# Patient Record
Sex: Female | Born: 1950 | Race: White | Hispanic: No | Marital: Married
Health system: Southern US, Community
[De-identification: ages and names within clinical notes are randomized; demographics above are authoritative.]

## PROBLEM LIST (undated history)

## (undated) DIAGNOSIS — G610 Guillain-Barre syndrome: Secondary | ICD-10-CM

## (undated) DIAGNOSIS — J45909 Unspecified asthma, uncomplicated: Secondary | ICD-10-CM

## (undated) DIAGNOSIS — E119 Type 2 diabetes mellitus without complications: Secondary | ICD-10-CM

---

## 2000-10-09 ENCOUNTER — Emergency Department (HOSPITAL_COMMUNITY): Admission: EM | Admit: 2000-10-09 | Discharge: 2000-10-09 | Payer: Self-pay | Admitting: Emergency Medicine

## 2000-10-09 ENCOUNTER — Encounter: Payer: Self-pay | Admitting: Emergency Medicine

## 2007-03-29 ENCOUNTER — Inpatient Hospital Stay (HOSPITAL_COMMUNITY): Admission: RE | Admit: 2007-03-29 | Discharge: 2007-04-01 | Payer: Self-pay | Admitting: Orthopedic Surgery

## 2007-05-24 ENCOUNTER — Ambulatory Visit (HOSPITAL_COMMUNITY): Admission: RE | Admit: 2007-05-24 | Discharge: 2007-05-25 | Payer: Self-pay | Admitting: Orthopedic Surgery

## 2008-05-11 IMAGING — CR DG CHEST 2V
3 series · 3 of 3 positions shown · non-contrast
Comparison: none

CLINICAL DATA: Osteoarthritis left knee. Preop workup. History of asthma, HBP. 
 CHEST - 2 VIEW:

[view not recorded (1 of 3)]
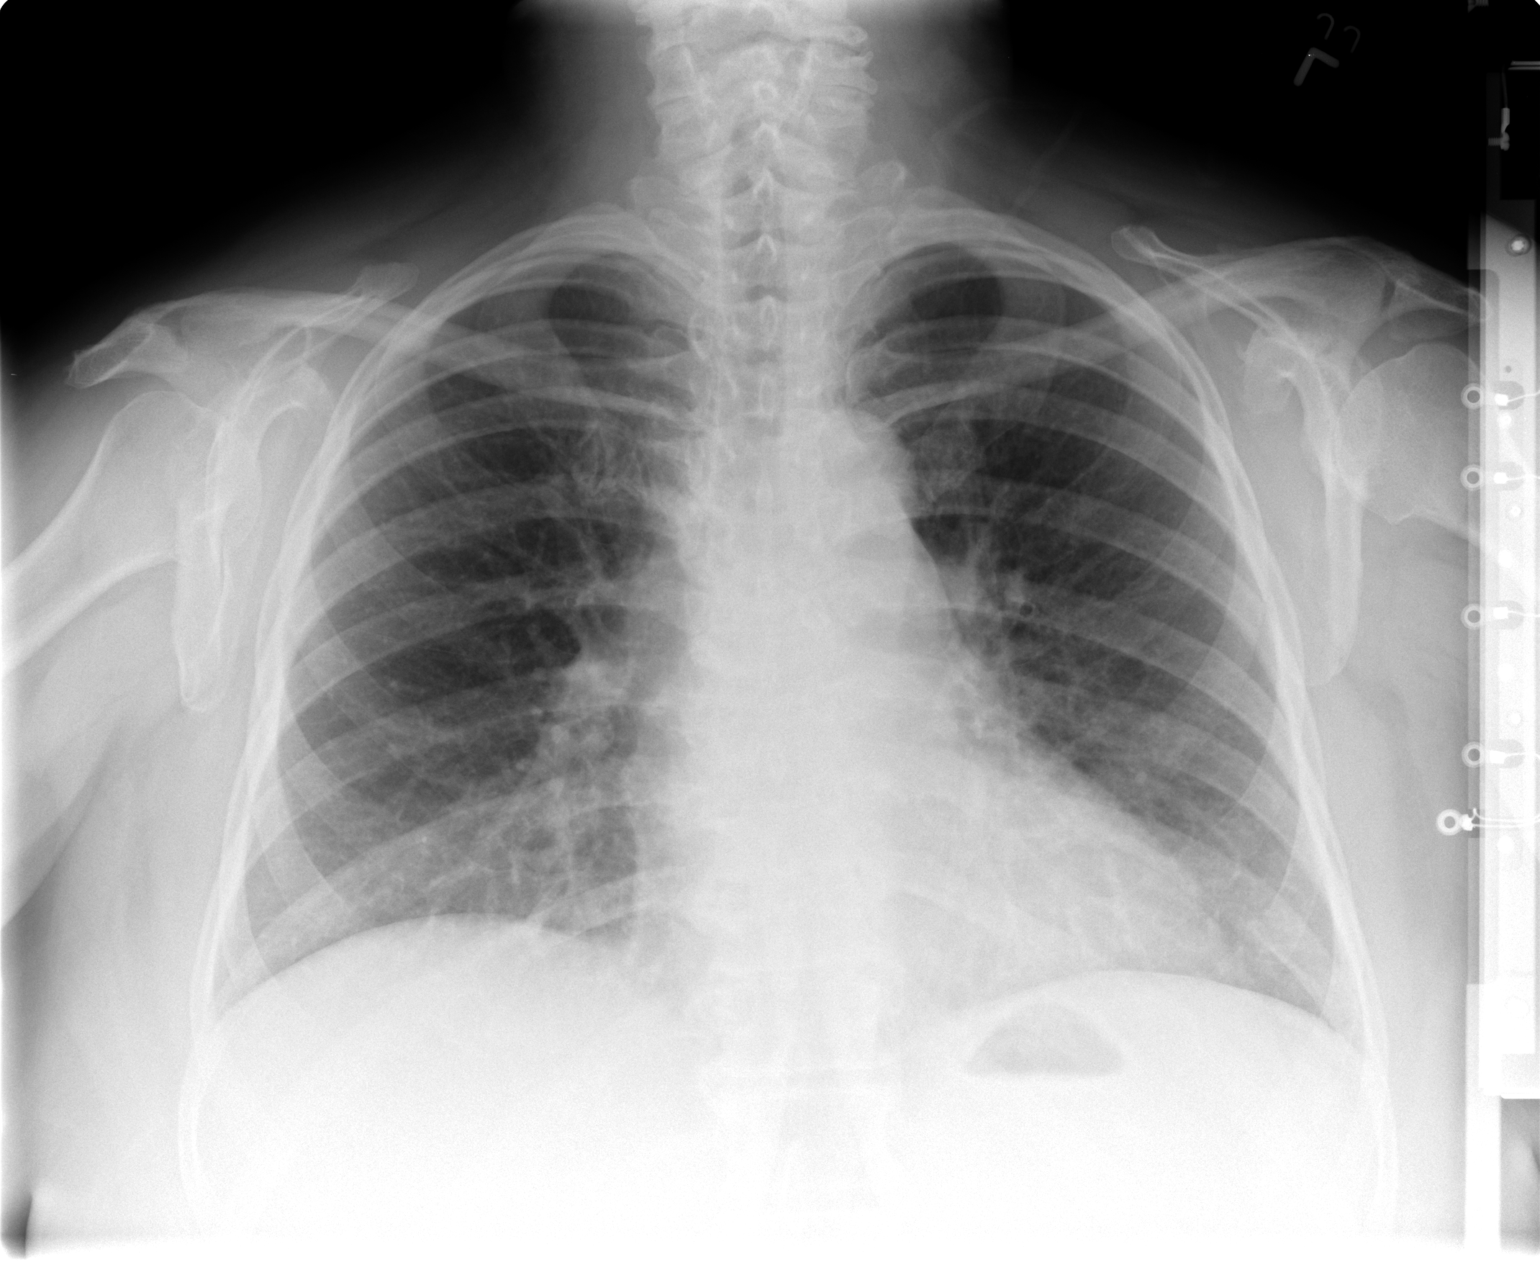

[view not recorded (2 of 3)]
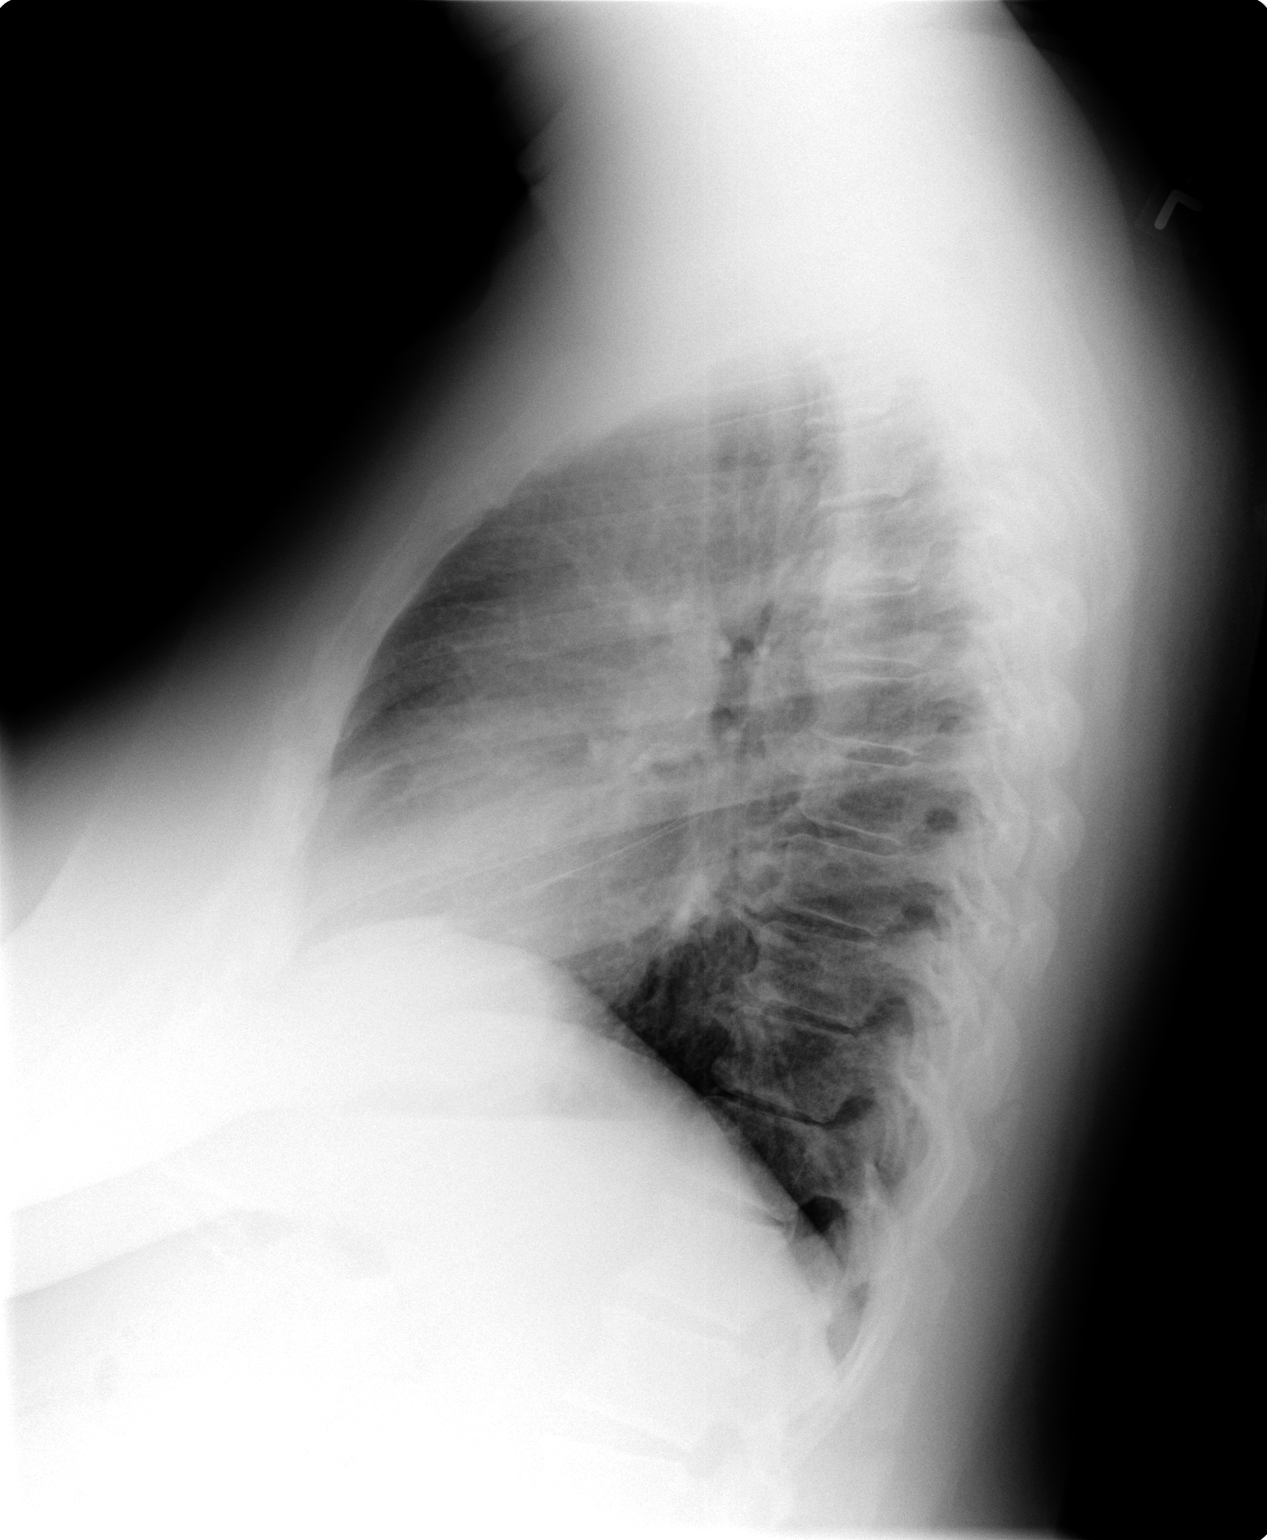

[view not recorded (3 of 3)]
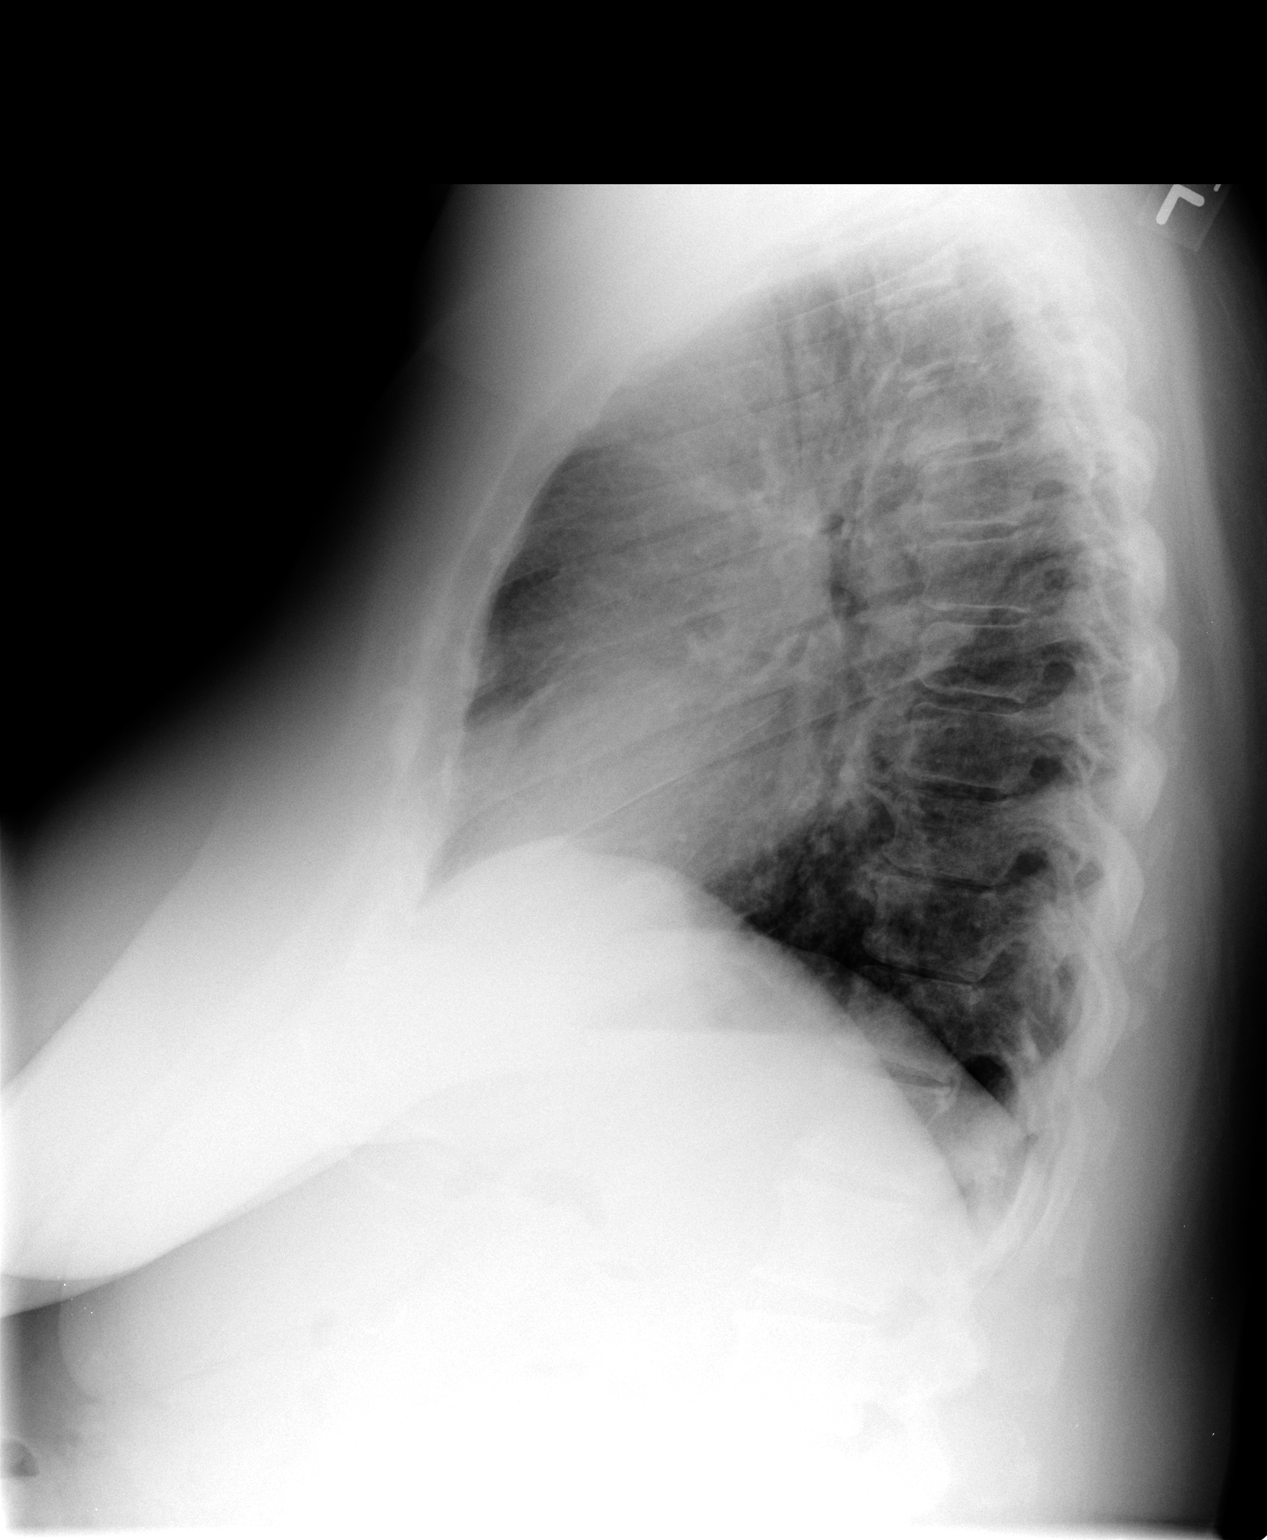

[3 of 3 positions shown; findings below may reference images not displayed]

FINDINGS: The cardiac size is towards the upper limits of normal. Peribronchial thickening compatible with chronic bronchitic changes. No active infiltrate. Bony thorax intact.
IMPRESSION: No acute chest findings. Chronic bronchitic findings.

## 2011-02-10 NOTE — Op Note (Signed)
[name], [name]            ACCOUNT NO.:  [id number]   MEDICAL RECORD NO.:  [id number]          PATIENT TYPE:  OIB   LOCATION:  1604                         FACILITY:  [facility]   PHYSICIAN:  Madlyn Frankel. Charlann Boxer, M.D.  DATE OF BIRTH:  [birth year]   DATE OF PROCEDURE:  05/24/2007  DATE OF DISCHARGE:                               OPERATIVE REPORT   PREOPERATIVE DIAGNOSIS:  Left total knee arthrofibrosis.   POSTOPERATIVE DIAGNOSIS:  Left total knee arthrofibrosis.   OPERATION PERFORMED:  1. Left knee examination under anesthesia.  Findings including range      of motion of 5 to 60 degrees.  2. Left knee manipulation under anesthesia with postoperative range of      motion from 3 to 130 degrees with significant lysis of adhesions      noted.   SURGEON:  Madlyn Frankel. Charlann Boxer, M.D.   ASSISTANT:  None.   ANESTHESIA:  Femoral regional nerve block and general LMA.   COMPLICATIONS:  None.   INDICATIONS FOR PROCEDURE:  [name] is a 61 year old female who is  now roughly seven weeks out from a left total knee replacement.  Her  postoperative course has been complicated only by some stiffness due to  inability to get adequate pain relief.  She is a diabetic.  I discussed  with her the current situation and recommended at this point given her  lack of progression from her two to six week visit, that we undergo  manipulation under anesthesia.  The risks and benefits were discussed.  Consent obtained.   DESCRIPTION OF PROCEDURE:  The patient was brought to the operating  theater.  Once adequate anesthesia was established and examination under  anesthesia was carried out, she had a 5 degree flexion contracture but  only flexed to about 60 to 70 degrees.  Very gentle pressure was applied  to the proximal fibula and distal femur with significant lysis of  adhesions.  I was able to flex it back to 130 degrees without much  effort.   I did work a little bit on her extension and was able to  lyse some  adhesions posteriorly improving her overall extension to close to 0  degrees.   I manipulated her patella and found that it was pretty mobile as well  signifying no significant scarring around this patella.  Following the  manipulation, the patient was awakened from anesthesia and brought to  recovery room in stable condition.   PLAN:  Plan will be for her to go on CPM immediately and then home on  CPM followed by immediate return to outpatient physical therapy.      Madlyn Frankel Charlann Boxer, M.D.  Electronically Signed     MDO/MEDQ  D:  05/24/2007  T:  05/25/2007  Job:  [id number]

## 2011-02-10 NOTE — Op Note (Signed)
[name], [name]            ACCOUNT NO.:  [id number]   MEDICAL RECORD NO.:  [id number]          PATIENT TYPE:  INP   LOCATION:  X002                         FACILITY:  [facility]   PHYSICIAN:  Madlyn Frankel. Charlann Boxer, M.D.  DATE OF BIRTH:  05/24/1951   DATE OF PROCEDURE:  03/29/2007  DATE OF DISCHARGE:                               OPERATIVE REPORT   PREOPERATIVE DIAGNOSIS:  Left knee osteoarthritis.   POSTOPERATIVE DIAGNOSIS:  Left knee osteoarthritis.   PROCEDURE:  Left total knee replacement.   COMPONENTS USED:  DePuy rotating platform posterior stabilized knee  system, size 3 femur, 3 tibia, 10 mm insert and a 38 patella button.   SURGEON:  Charlann Boxer.   ASSISTANT:  Dwyane Luo, P.A.   ANESTHESIA:  Duramorph spinal plus MAC anesthesia.   BLOOD LOSS:  Minimal.   TOURNIQUET TIME:  60 minutes, 300 mmHg.   COMPLICATIONS:  None.   DRAINS:  None.   INDICATIONS FOR PROCEDURE:  [name] is a 60 year old female who  presented to the office for left knee pain.  She failed conservative  measures.  Given the persistent and recurring symptoms, she wished to  proceed with surgery.  Radiographs indicated end-stage medial and  proximal changes.  Risks, benefits of surgery were discussed including  infection, DVT, component failure, need for revision as well as  postoperative pain and stiffness.  Consent obtained.   PROCEDURE IN DETAIL:  The patient was brought to the operative theater.  Once adequate anesthesia and preoperative antibiotics, 1 g of vancomycin  due to allergy administered, patient was positioned supine and a  proximal thigh tourniquet placed.  The left lower extremity was then  prescrubbed and prepped and draped in a sterile fashion.  A midline  incision was made followed by a median parapatellar arthrotomy with  patella subluxation as opposed to eversion of the patella.  Following  primary knee exposure, attention was directed to the patella.  Precut  measurement was 26 mm.   I resected down to 15 mm and used a 38 patella  button which took me back to 24 mm of patella height.   At this point attention was directed to the femur.  Femoral canal was  opened, irrigated to prevent fat emboli and intramedullary rod was then  passed.  I resected it 5 degrees valgus to the left knee, 11 mm of bone  due to a 5 mm flexion contracture preop.   I then sized the femur and determined it was going to be size 3,  anterior, posterior and Chamfer cuts were then made.  I did add a slight  bit of external rotation to the femoral cutting block matching a  perpendicular line to Whiteside's line in relationship to the  epicondylar axis.   I went ahead and finished the femoral preparation placing the trochlear  box cutting jig on the lateral aspect of the distal femur.  At this  point, attention was directed to the tibia.  Tibial exposure obtained,  meniscectomy was carried out as needed at this point.  I used an  extramedullary guide and resected 10 mm of bone  off the lateral tibia  and proximal tibia.  Following this resection, I attempted to pass an  extension block into place.  I felt that it was a little snug, so I  dropped down and removed two more millimeters of bone.  Following  cleaning up the bone cuts and removing any debris, I rechecked the  extension blocks and found the knee came out to full extension.  At this  point, I went ahead and finished the tibial surface with a size 3 tibial  tray including drilling and keel punching.  I did check with an  alignment rod to make sure it passed perpenicularly in the coronal and  sagittal planes.  At this point trial reduction was carried out with  size 3 tibia, 3 femur and 10 mm insert and again the knee came out in  full extension, had very stable ligament balance from extension to  flexion and the patella was noted to track without any application of  pressure.   At this point all trial components were removed, I  irrigated the knee  with pulse lavage, normal saline solution and injected the knee then in  the synovial cavity with 50 mL of 0.25% Marcaine with epinephrine and 1  mL of Toradol.   Cement was mixed, the final components were cemented in position,  extruded cement was removed once the knee was brought out to extension  with a 10 mL insert placed.  Once the cement had cured excessive cement  was debrided.  Once I was satisfied there was no remaining cement in the  knee, I irrigated the knee one more time, injected at few mL of FloSeal  into the posterior aspect of the knee and medial and lateral gutters and  then placed a final 10 x 3 posterior stabilized RP insert.  The knee was  reduced, remaining FloSeal placed into the gutters.  At this point the  knee was brought into flexion and extensor mechanism reapproximated  using #1 Vicryl, the remainder of the wound was closed in layers with #2-  0 Vicryl and #4-0 running Monocryl.  The knee was then dressed into a  sterile bulky dry dressing.  She was brought to the recovery room in  stable condition.      Madlyn Frankel Charlann Boxer, M.D.  Electronically Signed     MDO/MEDQ  D:  03/29/2007  T:  03/29/2007  Job:  [id number]

## 2011-02-10 NOTE — H&P (Signed)
NAMETALLY, MATTOX            ACCOUNT NO.:  [id number]   MEDICAL RECORD NO.:  [id number]        PATIENT TYPE:  LINP   LOCATION:                               FACILITY:  [facility]   PHYSICIAN:  Madlyn Frankel. Charlann Boxer, M.D.  DATE OF BIRTH:  [birth year]   DATE OF ADMISSION:  03/29/2007  DATE OF DISCHARGE:                              HISTORY & PHYSICAL   PROCEDURES:  A left total knee arthroplasty.   CHIEF COMPLAINT:  Left knee pain.   HISTORY OF PRESENT ILLNESS:  A 60 year old female with persistent  progressive knee pain secondary to osteoarthritis.  It has been  refractory to all conservative treatments.  She has been presurgically  assessed by Dr. Tarri Fuller.  Marland Kitchen   PAST MEDICAL HISTORY:  1. Osteoarthritis.  2. Diabetes.  3. Hypertension.   PAST SURGICAL HISTORY:  Tonsillectomy, appendectomy, cholecystectomy,  endometrial ablation, two foot surgeries, two knee arthroscopic  surgeries, and carpal tunnel release in 2006.   FAMILY HISTORY:  Heart disease, hypertension, diabetes, cancer, stroke,  arthritis.   SOCIAL HISTORY:  Husband will be primary caregiver after surgery.   DRUG ALLERGIES:  PENICILLIN, CODEINE, CLINDAMYCIN which give  anaphylactic reaction.   MEDICATIONS:  1. Tramadol 50 mg p.o. daily p.r.n.  2. Diovan 160 mg one p.o. daily.  3. Metoprolol 50 mg one p.o. daily.  4. Glimepiride 1 mg one p.o. daily.  5. Aspirin 81 mg one p.o. daily.  6. Methotrexate 2.5 mg four once a week.   REVIEW OF SYSTEMS:  PULMONARY:  She has history of asthma, bronchitis.  GYNECOLOGICAL:  Postmenopausal with hot flashes.  Otherwise, see HPI.   PHYSICAL EXAMINATION:  VITAL SIGNS:  Pulse 76, respirations 18, blood  pressure 174/86.  GENERAL:  Awake, alert, and oriented, well-developed, well-nourished, in  no acute distress.  NECK:  Supple.  No carotid bruits.  CHEST:  Lungs clear to auscultation bilaterally.  BREASTS:  Deferred.  HEART:  Regular rate and rhythm without gallops,  clicks, rubs or  murmurs.  ABDOMEN:  Soft, nontender, nondistended.  Bowel sounds present.  GENITOURINARY:  Deferred.  EXTREMITIES:  Left lower extremity has flexion contracture, medial sided  tenderness.  She has a dorsalis pedis pulse.  SKIN:  No cellulitis.  Old scope portals well healed.  NEUROLOGIC:  Intact distal sensibilities.   LABORATORY DATA:  Labs, chest x-ray, EKG pending presurgical assessment.   IMPRESSION:  1. Osteoarthritis.  2. Diabetes type 2.  3. Hypertension.   PLAN OF ACTION:  Left total knee arthroplasty by surgeon Dr. Durene Romans on March 29, 2007 at [facility].  Risks and complications  were discussed.  Questions were encouraged, answered, and reviewed.   Postoperative medication prescription provided as well as Celebrex for a  preoperative treatment as well as postoperative for two days.     ______________________________  Yetta Glassman [address], [place]      Madlyn Frankel. Charlann Boxer, M.D.  Electronically Signed    BLM/MEDQ  D:  03/15/2007  T:  03/15/2007  Job:  [id number]

## 2011-02-13 NOTE — Discharge Summary (Signed)
[name], [name]            ACCOUNT NO.:  [id number]   MEDICAL RECORD NO.:  [id number]          PATIENT TYPE:  INP   LOCATION:  1603                         FACILITY:  [facility]   PHYSICIAN:  Madlyn Frankel. Charlann Boxer, M.D.  DATE OF BIRTH:  [birth year]   DATE OF ADMISSION:  03/29/2007  DATE OF DISCHARGE:  04/01/2007                               DISCHARGE SUMMARY   ADMISSION DIAGNOSES:  1. Osteoarthritis.  2. Diabetes, type 2.  3. Hypertension.   DISCHARGE DIAGNOSES:  1. Osteoarthritis.  2. Diabetes, type 2.  3. Hypertension.   CONSULTATIONS:  None.   PROCEDURE:  Left total knee arthroplasty by surgeon Dr. Durene Romans,  assistant Dwyane Luo PA.   HISTORY OF PRESENT ILLNESS:  [name] is a 60 year old female with  persistent progressive left knee pain secondary to osteoarthritis  refractory to all conservative treatments.  Presurgical assessed by Dr.  Tarri Fuller.   LABORATORY DATA AND X-RAY FINDINGS:  Preoperative CBC with hematocrit  38.4, postop day #1 at 30, postop day #2 at 31.6.  Coagulation preop  within normal limits.  Routine chemistry preop glucose 358, postop day  #1 at 257, postop day #2 at  229.  Routine chemistry kidney function  well-perfused calcium 8.7 at discharge.  Albumin 3.3.  UA with glucose  greater than 1000 and a few epithelials.   Chest two-view with no acute chest findings.  Chronic bronchitic  findings.  EKG normal sinus rhythm.   HOSPITAL COURSE:  The patient underwent left total knee replacement and  tolerated procedure well and was admitted to orthopedic floor.  She  remained neurovascularly intact throughout her course of stay.  Dressing  was changed after postop day #1 on daily basis with no significant  drainage from the wound.  PT/OT weightbearing as tolerated begun on  postop day #1, as well as DVT prophylaxis.  She did experience some  hypoglycemia and her medical doctor was consulted while in the hospital.  She was started on  Humalog 10 units before meals and then rechecked  afterwards.  She also planned on following up with her primary care  doctor who manages her diabetes after discharge.  She did have some  nausea and vomiting after postop day #3, increasing her pain medications  at little bit and switching her pain medicine over to Percocet.  By  postop day #3, on July 4, it had resolved by the end of the day and she  was ready for discharge home.   DISPOSITION:  Discharged home with home health care and PT in stable and  improved condition.   ACTIVITY:  Weightbearing as tolerated with the use of rolling walker.  Minimize pain, maximize strength, increase range of motion.   DIET:  Regular.   WOUND CARE:  Keep dry and change daily.   FOLLOW UP:  Follow with Dr. Charlann Boxer at [phone], in 2 weeks.  Follow up  with Dr. Watt Climes at [phone], in 2-3 weeks for diabetic control.   DISCHARGE MEDICATIONS:  1. Lovenox 30 mg subcutaneous q.12 h. x11 days.  2. Robaxin 500 mg q.6 h.  3. Colace 100  mg b.i.d.  4. MiraLax 17 g p.o. daily.  5. Iron 325 mg p.o. t.i.d.  6. Enteric-coated aspirin 325 mg p.o. daily x4 weeks after Lovenox      completed.  7. Vicodin 5/325 one to two p.o. q.4-6 h. p.r.n. pain.  8. Lipitor 10 mg p.o. q.a.m.  9. Metoprolol 50 mg p.o. daily.  10.Diovan 160 mg p.o. daily.  11.Glimepiride 1 mg p.o. daily.  12.Clotrimazole/betamethasone topical one to two times daily.  13.Tylenol PM nightly.     ______________________________  Yetta Glassman. [address], [place]      Madlyn Frankel. Charlann Boxer, M.D.  Electronically Signed    BLM/MEDQ  D:  04/19/2007  T:  04/19/2007  Job:  [id number]

## 2011-07-10 LAB — BASIC METABOLIC PANEL
BUN: 12
CO2: 29
Calcium: 10
Chloride: 101
Creatinine, Ser: 0.46
GFR calc Af Amer: >60
GFR calc non Af Amer: >60
Glucose, Bld: 153 — ABNORMAL HIGH
Potassium: 4.7
Sodium: 140

## 2011-07-10 LAB — URINALYSIS, ROUTINE W REFLEX MICROSCOPIC
Bilirubin Urine: NEGATIVE
Glucose, UA: NEGATIVE
Hgb urine dipstick: NEGATIVE
Ketones, ur: NEGATIVE
Nitrite: NEGATIVE
Protein, ur: NEGATIVE
Specific Gravity, Urine: 1.028
Urobilinogen, UA: 0.2
pH: 6

## 2011-07-10 LAB — HEMOGLOBIN AND HEMATOCRIT, BLOOD
HCT: 37.5
Hemoglobin: 13.1

## 2011-07-14 LAB — TYPE AND SCREEN
ABO/RH(D): O POS
Antibody Screen: NEGATIVE

## 2011-07-14 LAB — CBC
Hemoglobin: 10.6 — ABNORMAL LOW
Hemoglobin: 11.1 — ABNORMAL LOW
MCHC: 35.1
MCHC: 35.5
MCV: 86
MCV: 86.1
RBC: 3.49 — ABNORMAL LOW
RBC: 3.67 — ABNORMAL LOW
RDW: 12.6
WBC: 7

## 2011-07-14 LAB — BASIC METABOLIC PANEL
CO2: 27
CO2: 30
Calcium: 8.7
Chloride: 102
Chloride: 103
Creatinine, Ser: 0.5
GFR calc Af Amer: >60
GFR calc Af Amer: >60
Glucose, Bld: 229 — ABNORMAL HIGH
Potassium: 3.9
Sodium: 136

## 2011-07-14 LAB — ABO/RH: ABO/RH(D): O POS

## 2011-07-15 LAB — COMPREHENSIVE METABOLIC PANEL
ALT: 20
AST: 20
Albumin: 3.3 — ABNORMAL LOW
Calcium: 9.8
Creatinine, Ser: 0.52
GFR calc Af Amer: >60
Sodium: 137
Total Protein: 6.8

## 2011-07-15 LAB — URINE MICROSCOPIC-ADD ON

## 2011-07-15 LAB — CBC
MCHC: 35.2
MCV: 85.6
Platelets: 284
RBC: 4.48
WBC: 7.6

## 2011-07-15 LAB — URINALYSIS, ROUTINE W REFLEX MICROSCOPIC
Glucose, UA: >1000 — AB
Ketones, ur: NEGATIVE
Leukocytes, UA: NEGATIVE
Nitrite: NEGATIVE
Specific Gravity, Urine: 1.027
pH: 5.5

## 2011-07-15 LAB — APTT: aPTT: 29

## 2022-03-28 ENCOUNTER — Encounter (HOSPITAL_BASED_OUTPATIENT_CLINIC_OR_DEPARTMENT_OTHER): Payer: Self-pay | Admitting: Emergency Medicine

## 2022-03-28 ENCOUNTER — Emergency Department (HOSPITAL_BASED_OUTPATIENT_CLINIC_OR_DEPARTMENT_OTHER)
Admission: EM | Admit: 2022-03-28 | Discharge: 2022-03-28 | Disposition: A | Discharge status: Home / Self Care | Payer: Medicare HMO | Attending: Emergency Medicine | Admitting: Emergency Medicine

## 2022-03-28 ENCOUNTER — Other Ambulatory Visit: Payer: Self-pay

## 2022-03-28 ENCOUNTER — Emergency Department (HOSPITAL_BASED_OUTPATIENT_CLINIC_OR_DEPARTMENT_OTHER): Payer: Medicare HMO

## 2022-03-28 DIAGNOSIS — R109 Unspecified abdominal pain: Secondary | ICD-10-CM | POA: Insufficient documentation

## 2022-03-28 DIAGNOSIS — S3992XA Unspecified injury of lower back, initial encounter: Secondary | ICD-10-CM | POA: Diagnosis present

## 2022-03-28 DIAGNOSIS — R030 Elevated blood-pressure reading, without diagnosis of hypertension: Secondary | ICD-10-CM | POA: Diagnosis not present

## 2022-03-28 DIAGNOSIS — S29012A Strain of muscle and tendon of back wall of thorax, initial encounter: Secondary | ICD-10-CM | POA: Insufficient documentation

## 2022-03-28 DIAGNOSIS — S39012A Strain of muscle, fascia and tendon of lower back, initial encounter: Secondary | ICD-10-CM | POA: Diagnosis not present

## 2022-03-28 DIAGNOSIS — S29019A Strain of muscle and tendon of unspecified wall of thorax, initial encounter: Secondary | ICD-10-CM

## 2022-03-28 DIAGNOSIS — W19XXXA Unspecified fall, initial encounter: Secondary | ICD-10-CM | POA: Diagnosis not present

## 2022-03-28 HISTORY — DX: Unspecified asthma, uncomplicated: J45.909

## 2022-03-28 HISTORY — DX: Guillain-Barre syndrome: G61.0

## 2022-03-28 HISTORY — DX: Type 2 diabetes mellitus without complications: E11.9

## 2022-03-28 MED ORDER — IBUPROFEN 400 MG PO TABS
400.0000 mg | ORAL_TABLET | Freq: Once | ORAL | Status: AC
  Administered 2022-03-28: 400 mg via ORAL
  Filled 2022-03-28: qty 1

## 2022-03-28 MED ORDER — METHOCARBAMOL 500 MG PO TABS
500.0000 mg | ORAL_TABLET | Freq: Once | ORAL | Status: AC
  Administered 2022-03-28: 500 mg via ORAL
  Filled 2022-03-28: qty 1

## 2022-03-28 MED ORDER — TRAMADOL HCL 50 MG PO TABS: 50.0000 mg | ORAL_TABLET | Freq: Four times a day (QID) | ORAL | 0 refills | Status: AC | PRN

## 2022-03-28 MED ORDER — METHOCARBAMOL 500 MG PO TABS: 500.0000 mg | ORAL_TABLET | Freq: Three times a day (TID) | ORAL | 0 refills | Status: AC | PRN

## 2022-03-28 MED ORDER — OXYCODONE-ACETAMINOPHEN 5-325 MG PO TABS
1.0000 | ORAL_TABLET | Freq: Once | ORAL | Status: AC
  Administered 2022-03-28: 1 via ORAL
  Filled 2022-03-28: qty 1

## 2022-03-28 NOTE — ED Provider Notes (Signed)
[facility] EMERGENCY DEPARTMENT Provider Note   CSN: [id number] Arrival date & time: 03/28/22  1310     History  Chief Complaint  Patient presents with   Flank Pain    [name] is a 71 y.o. female.  Pt with c/o low back and left flank pain posteriorly, in the past 3-4 days. Symptoms acute onset, dull, moderate, persistent, non radiating, worse w certain movements and positional changes. No saddle area or leg numbness. No weakness. No problems w normal bladder and bowel function. No dysuria or hematuria. No hx kidney stones. States did have a fall ~ 2 weeks ago, but denies back pain then. Denies any 'anterior' pain - no abd or pelvic pain. Normal appetite. No fever/chills or sweats. Has not noticed any sts, redness or skin lesions to area of pain.   The history is provided by the patient and medical records.  Flank Pain Pertinent negatives include no chest pain, no abdominal pain, no headaches and no shortness of breath.       Home Medications Prior to Admission medications   Medication Sig Start Date End Date Taking? Authorizing Provider  methocarbamol (ROBAXIN) 500 MG tablet Take 1 tablet (500 mg total) by mouth 3 (three) times daily as needed (muscle spasm/pain). 03/28/22  Yes Cathren Laine, MD  traMADol (ULTRAM) 50 MG tablet Take 1 tablet (50 mg total) by mouth every 6 (six) hours as needed. 03/28/22  Yes Cathren Laine, MD      Allergies    Clindamycin/lincomycin, Codeine, and Penicillins    Review of Systems   Review of Systems  Constitutional:  Negative for chills and fever.  Respiratory:  Negative for cough and shortness of breath.   Cardiovascular:  Negative for chest pain.  Gastrointestinal:  Negative for abdominal pain, nausea and vomiting.  Genitourinary:  Positive for flank pain. Negative for dysuria and hematuria.  Musculoskeletal:  Positive for back pain. Negative for neck pain.  Skin:  Negative for rash.  Neurological:  Negative for weakness,  numbness and headaches.  Hematological:        No anticoag use.     Physical Exam Updated Vital Signs BP (!) 163/67   Pulse 85   Temp 97.9 F (36.6 C) (Oral)   Resp 20   Ht 1.6 m (5\' 3" )   Wt 85.7 kg   SpO2 100%   BMI 33.48 kg/m  Physical Exam Vitals and nursing note reviewed.  Constitutional:      Appearance: Normal appearance. She is well-developed.  HENT:     Head: Atraumatic.     Nose: Nose normal.     Mouth/Throat:     Mouth: Mucous membranes are moist.  Eyes:     General: No scleral icterus.    Conjunctiva/sclera: Conjunctivae normal.  Neck:     Trachea: No tracheal deviation.  Cardiovascular:     Rate and Rhythm: Normal rate and regular rhythm.     Pulses: Normal pulses.     Heart sounds: Normal heart sounds. No murmur heard.    No friction rub. No gallop.  Pulmonary:     Effort: Pulmonary effort is normal. No respiratory distress.     Breath sounds: Normal breath sounds.  Abdominal:     General: Bowel sounds are normal. There is no distension.     Palpations: Abdomen is soft. There is no mass.     Tenderness: There is no abdominal tenderness. There is no guarding or rebound.     Hernia: No  hernia is present.  Genitourinary:    Comments: No cva tenderness.  Musculoskeletal:        General: No swelling.     Cervical back: Normal range of motion and neck supple. No rigidity. No muscular tenderness.     Comments: T/L/S spine non tender, aligned, no step off. Left lumbar muscular tenderness, pain, spasm.   Skin:    General: Skin is warm and dry.     Findings: No rash.     Comments: No skin lesions/rash in area of pain.   Neurological:     Mental Status: She is alert.     Comments: Alert, speech normal. Motor/sens grossly intact. Steady gait.   Psychiatric:        Mood and Affect: Mood normal.     ED Results / Procedures / Treatments   Labs (all labs ordered are listed, but only abnormal results are displayed) Labs Reviewed - No data to  display  EKG None  Radiology CT Renal Stone Study  Result Date: 03/28/2022 CLINICAL DATA:  LEFT flank pain since Wednesday, fell a few weeks ago EXAM: CT ABDOMEN AND PELVIS WITHOUT CONTRAST TECHNIQUE: Multidetector CT imaging of the abdomen and pelvis was performed following the standard protocol without IV contrast. RADIATION DOSE REDUCTION: This exam was performed according to the departmental dose-optimization program which includes automated exposure control, adjustment of the mA and/or kV according to patient size and/or use of iterative reconstruction technique. COMPARISON:  07/22/2013 FINDINGS: Lower chest: Bibasilar peripheral interstitial infiltrates progressive since prior study. Mitral annular calcification. Hepatobiliary: Gallbladder surgically absent. Liver normal appearance. Pancreas: Atrophic pancreas Spleen: Normal appearance Adrenals/Urinary Tract: Adrenal glands, kidneys, ureters, and bladder normal appearance. No urinary tract calcification or dilatation. Stomach/Bowel: Stomach and bowel loops normal appearance. Appendix not visualized. Vascular/Lymphatic: Atherosclerotic calcifications aorta and iliac arteries without aneurysm. No adenopathy. Reproductive: Uterus and ovaries normal appearance Other: No free air or free fluid. No hernia or inflammatory process. Musculoskeletal: Prior L4-L5 fusion. Osseous demineralization. No acute bony findings. IMPRESSION: No acute intra-abdominal or intrapelvic abnormalities. Progressive bibasilar peripheral interstitial infiltrates. Aortic Atherosclerosis (ICD10-I70.0). Electronically Signed   By: Ulyses Southward M.D.   On: 03/28/2022 14:27    Procedures Procedures    Medications Ordered in ED Medications  ibuprofen (ADVIL) tablet 400 mg (has no administration in time range)  methocarbamol (ROBAXIN) tablet 500 mg (has no administration in time range)  oxyCODONE-acetaminophen (PERCOCET/ROXICET) 5-325 MG per tablet 1 tablet (1 tablet Oral Given  03/28/22 1353)    ED Course/ Medical Decision Making/ A&P                           Medical Decision Making Problems Addressed: Elevated blood pressure reading: acute illness or injury Left flank pain: acute illness or injury with systemic symptoms that poses a threat to life or bodily functions Strain of lumbar region, initial encounter: acute illness or injury Thoracic myofascial strain, initial encounter: acute illness or injury  Amount and/or Complexity of Data Reviewed External Data Reviewed: notes. Radiology: ordered and independent interpretation performed. Decision-making details documented in ED Course.  Risk Prescription drug management.   Imaging studies ordered.   Reviewed nursing notes and prior charts for additional history.   CT reviewed/interpreted by me - no ureteral stone.   Pt with no midline/spine tenderness. On exam, left lumbar muscular tenderness/spasm.  ED given percocet, robaxin, ibuprofen.   Pain is improved.   Pt currently appears stable for d/c.  Rec pcp f/u.  Return precautions provided.           Final Clinical Impression(s) / ED Diagnoses Final diagnoses:  Left flank pain  Elevated blood pressure reading  Thoracic myofascial strain, initial encounter  Strain of lumbar region, initial encounter    Rx / DC Orders ED Discharge Orders          Ordered    methocarbamol (ROBAXIN) 500 MG tablet  3 times daily PRN        03/28/22 1533    traMADol (ULTRAM) 50 MG tablet  Every 6 hours PRN        03/28/22 1533              Cathren Laine, MD 03/28/22 1544

## 2022-03-28 NOTE — Discharge Instructions (Addendum)
It was our pleasure to provide your ER care today - we hope that you feel better.  Avoid bending at waist or heavy lifting > 10 lbs for the next 3-4 days.   Try heat therapy, gentle massage and/or gentle stretching to sore area.   Take acetaminophen or ibuprofen as need for pain. You may also try robaxin as need for muscle pain/spasm, or ultram as need for pain  - no driving when taking these meds.   Follow up with primary care doctor in the next 1-2 weeks - also have blood pressure rechecked then as it is high today.  Return to ER if worse, new symptoms, fevers, intractable pain, numbness/weakness, or other concern.

## 2022-03-28 NOTE — ED Triage Notes (Signed)
Pt reports L flank pain that began on Wednesday. Denies urinary symptoms.  Larey Seat a few weeks ago and not sure if it is related.

## 2022-10-29 DEATH — deceased
